# Patient Record
Sex: Female | Born: 1985
Health system: Southern US, Community
[De-identification: ages and names within clinical notes are randomized; demographics above are authoritative.]

## PROBLEM LIST (undated history)

## (undated) DIAGNOSIS — Z789 Other specified health status: Secondary | ICD-10-CM

## (undated) HISTORY — DX: Other specified health status: Z78.9

## (undated) HISTORY — PX: WISDOM TOOTH EXTRACTION: SHX21

---

## 2006-11-14 ENCOUNTER — Emergency Department: Payer: Self-pay | Admitting: Unknown Physician Specialty

## 2006-12-27 ENCOUNTER — Emergency Department: Payer: Self-pay | Admitting: Emergency Medicine

## 2008-03-28 ENCOUNTER — Observation Stay: Payer: Self-pay | Admitting: Obstetrics and Gynecology

## 2008-04-17 ENCOUNTER — Ambulatory Visit: Payer: Self-pay

## 2008-04-21 ENCOUNTER — Inpatient Hospital Stay: Payer: Self-pay

## 2008-07-07 ENCOUNTER — Emergency Department: Payer: Self-pay | Admitting: Emergency Medicine

## 2010-04-25 ENCOUNTER — Emergency Department: Payer: Self-pay | Admitting: Emergency Medicine

## 2013-03-21 ENCOUNTER — Emergency Department: Payer: Self-pay | Admitting: Emergency Medicine

## 2013-03-21 LAB — COMPREHENSIVE METABOLIC PANEL
Albumin: 3.9 g/dL (ref 3.4–5.0)
Alkaline Phosphatase: 89 U/L (ref 50–136)
Anion Gap: 5 — ABNORMAL LOW (ref 7–16)
Calcium, Total: 9.3 mg/dL (ref 8.5–10.1)
Creatinine: 0.86 mg/dL (ref 0.60–1.30)
Glucose: 140 mg/dL — ABNORMAL HIGH (ref 65–99)
Osmolality: 280 (ref 275–301)
Potassium: 3.4 mmol/L — ABNORMAL LOW (ref 3.5–5.1)
SGOT(AST): 28 U/L (ref 15–37)
Sodium: 138 mmol/L (ref 136–145)

## 2013-03-21 LAB — CBC
HGB: 13.6 g/dL (ref 12.0–16.0)
MCHC: 35.5 g/dL (ref 32.0–36.0)
MCV: 86 fL (ref 80–100)
Platelet: 165 10*3/uL (ref 150–440)
RBC: 4.47 10*6/uL (ref 3.80–5.20)

## 2014-03-13 IMAGING — CT CT HEAD WITHOUT CONTRAST
2 series · 16 of 30 positions shown, 20 images · non-contrast
Comparison: none

REASON FOR EXAM: dizziness
COMMENTS:

[Series 2: without · axial · non-contrast · 0.38mm/px · z∈[+441,+561]mm · 13 of 29 slices shown, 17 images]
[im 3/29  brain]
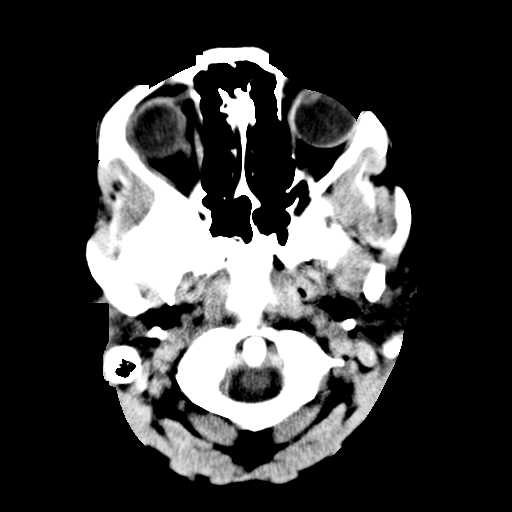
[im 3/29  bone]
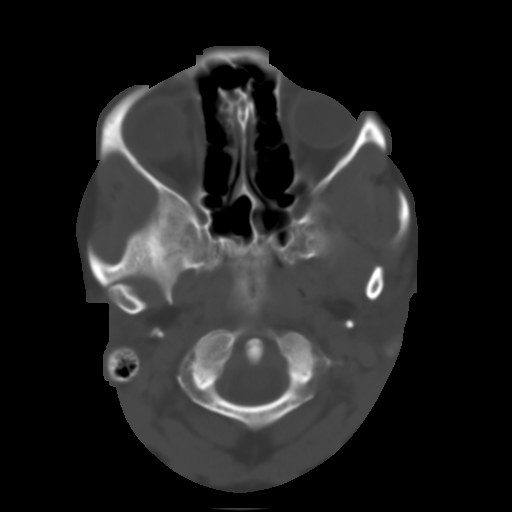
[im 5/29  brain]
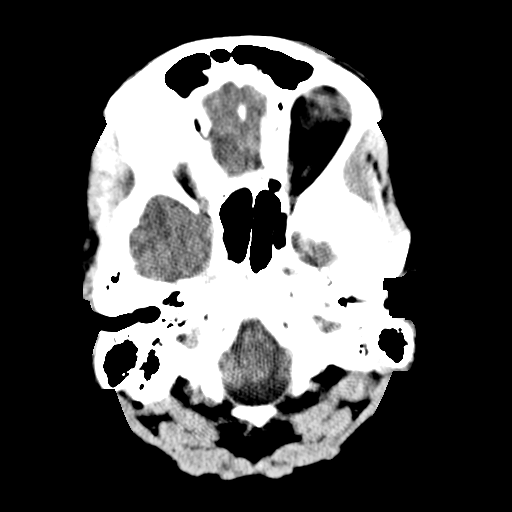
[im 7/29  brain]
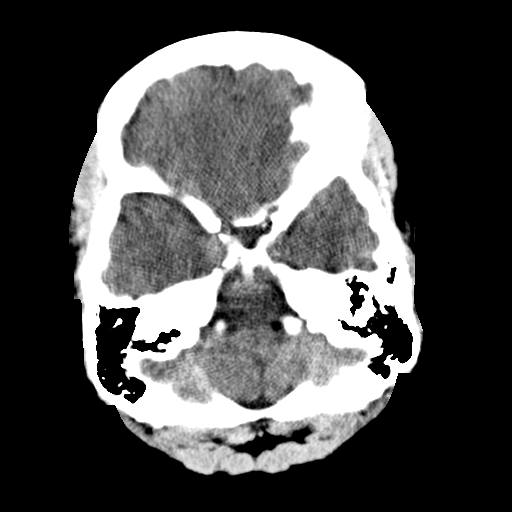
[im 9/29  brain]
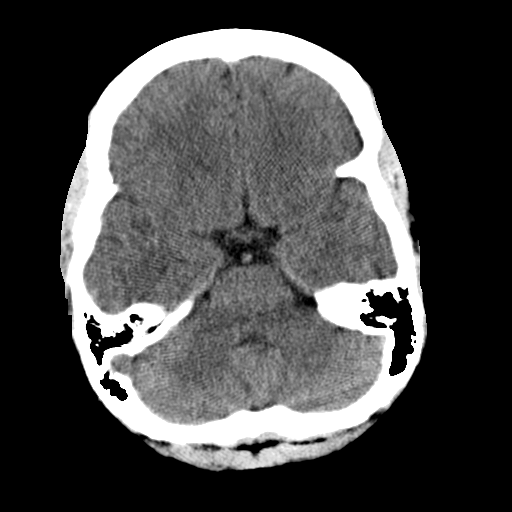
[im 11/29  brain]
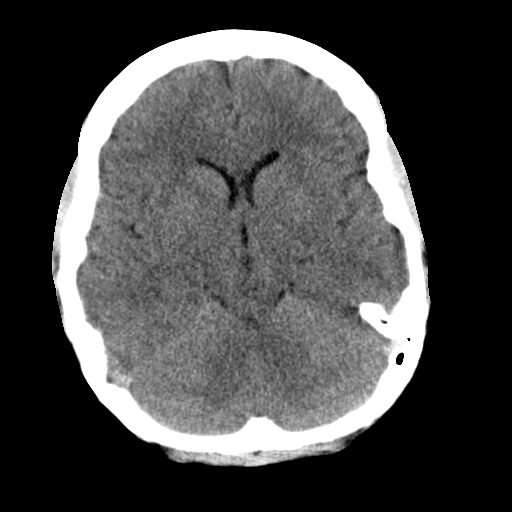
[im 11/29  bone]
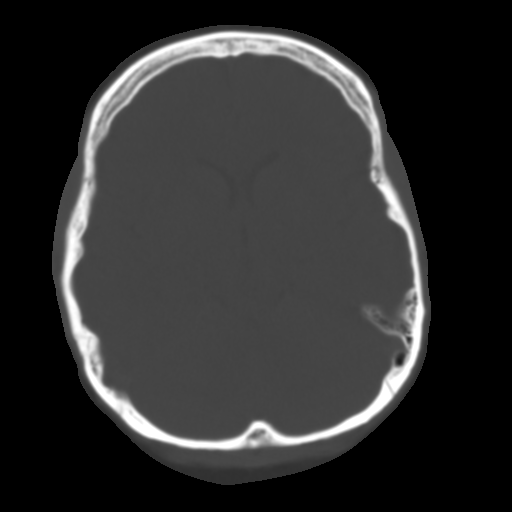
[im 13/29  brain]
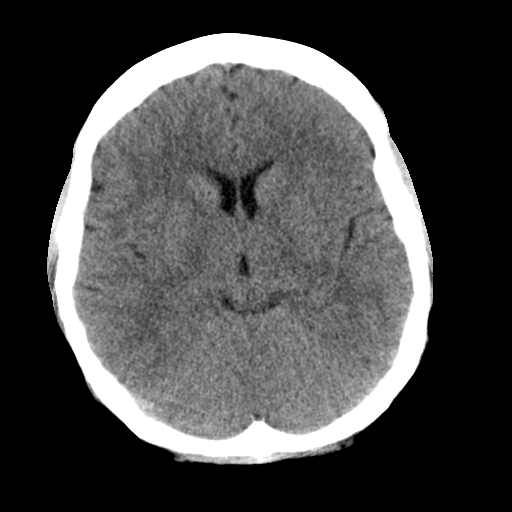
[im 15/29  brain]
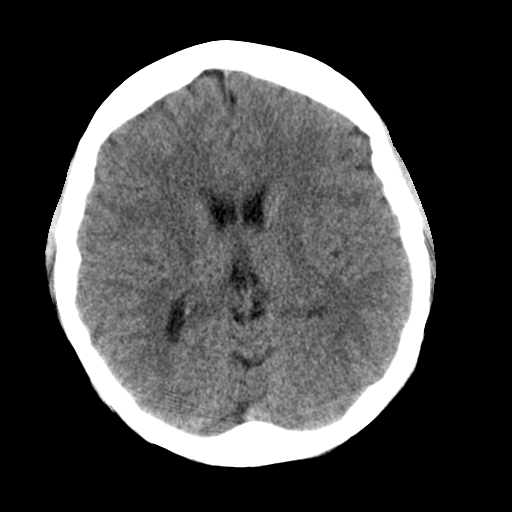
[im 17/29  brain]
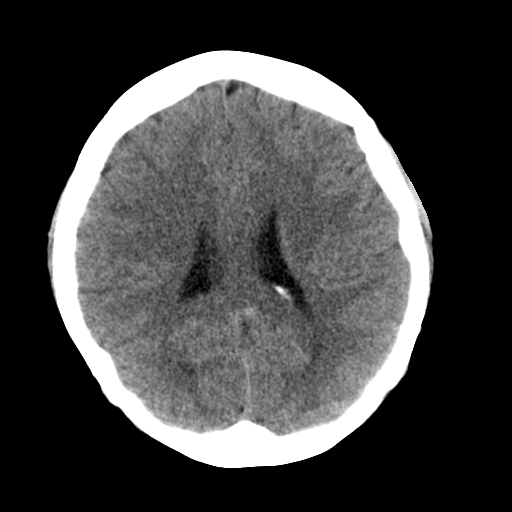
[im 19/29  brain]
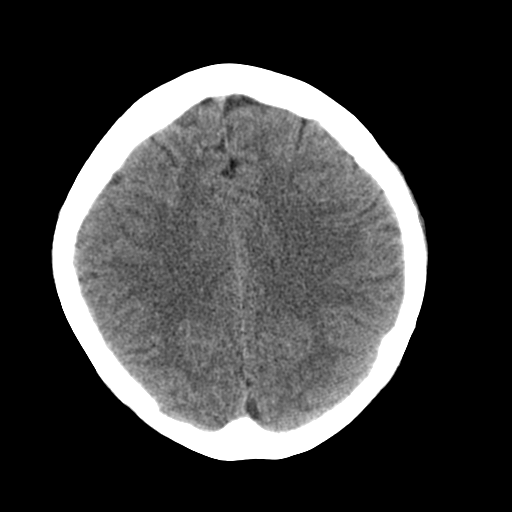
[im 19/29  bone]
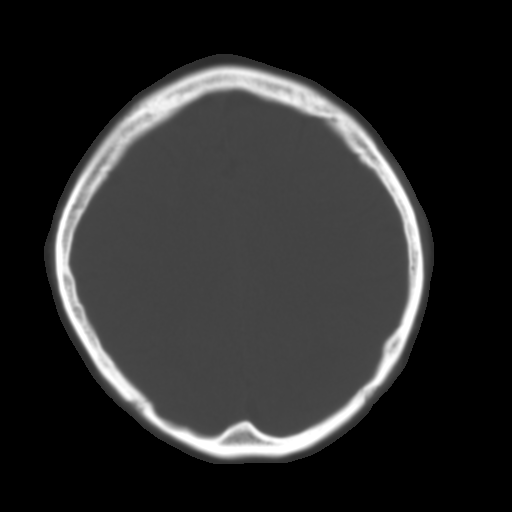
[im 21/29  brain]
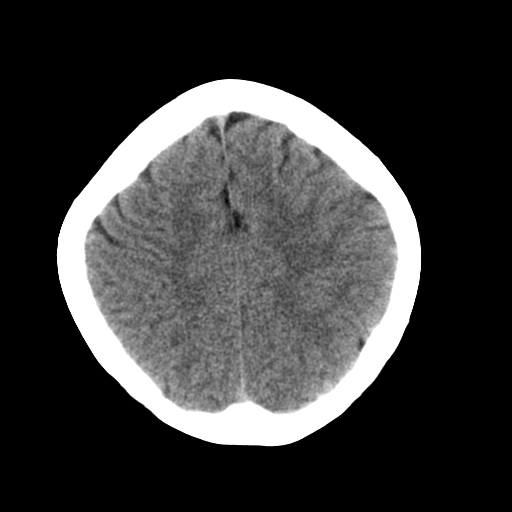
[im 23/29  brain]
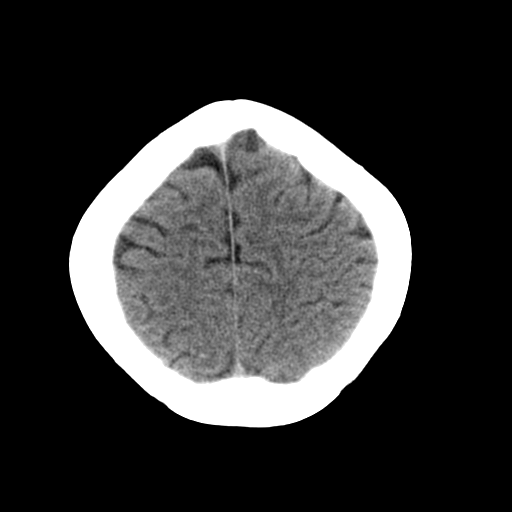
[im 25/29  brain]
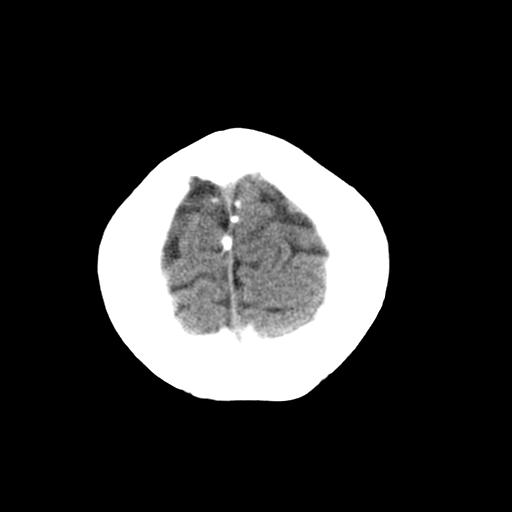
[im 27/29  brain]
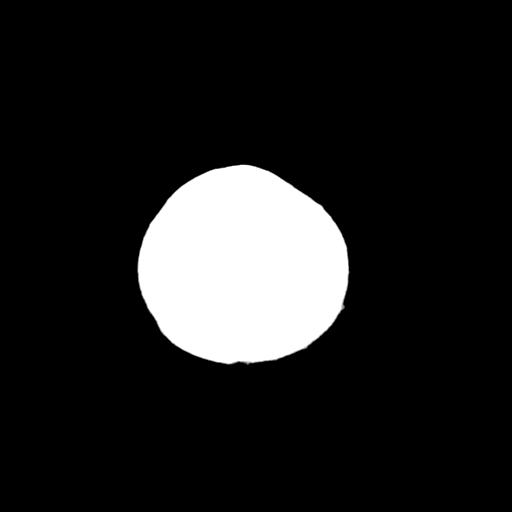
[im 27/29  bone]
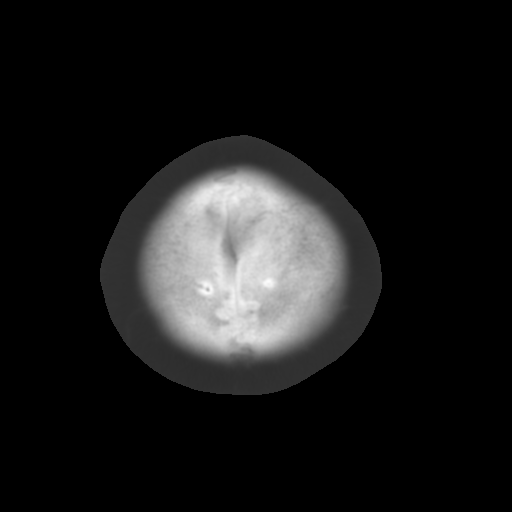

[Series 3: bone · axial · 0.38mm/px · z∈[+441,+481]mm · 3 of 29 slices shown]
[im 3/29  bone]
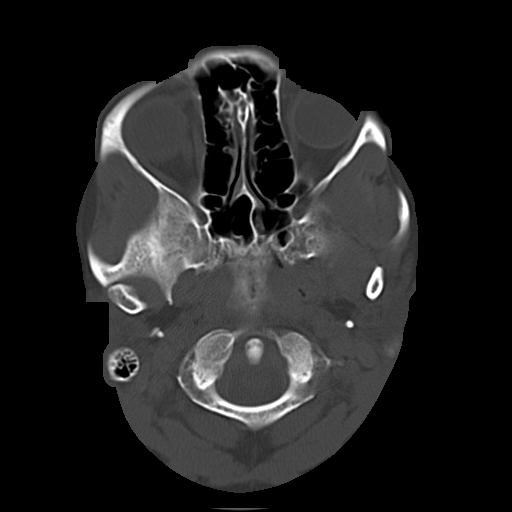
[im 7/29  bone]
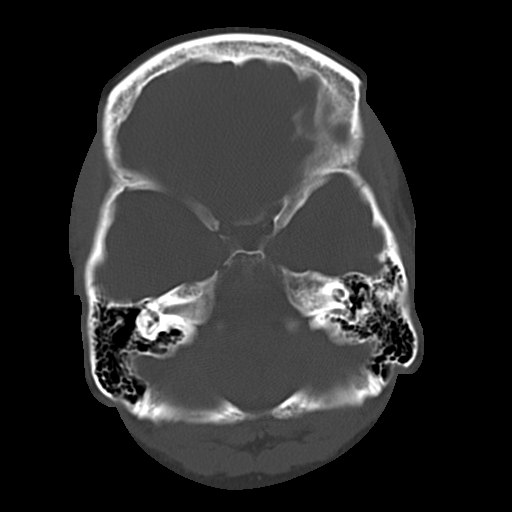
[im 11/29  bone]
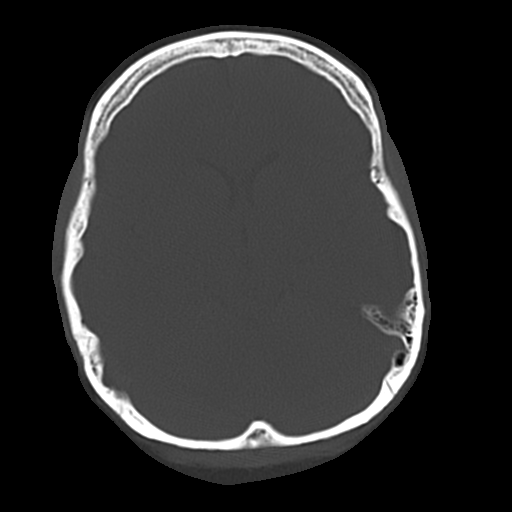

[16 of 30 positions shown; findings below may reference images not displayed]

PROCEDURE:     CT  - CT HEAD WITHOUT CONTRAST  - March 22, 2013  [DATE]

RESULT:     Axial noncontrast CT scanning was performed through the brain
with reconstructions at 5 mm intervals and slice thicknesses.

The ventricles are normal in size and position. There is no intracranial
hemorrhage nor intracranial mass effect. There is no evidence of an evolving
ischemic infarction. The cerebellum and brainstem are normal in density. At
bone window settings there is no evidence of an acute skull fracture.
IMPRESSION: Normal noncontrast CT scan of the brain.

A preliminary report was sent to the [HOSPITAL] the conclusion
of the study.

[REDACTED]

## 2016-02-08 ENCOUNTER — Encounter: Payer: Self-pay | Admitting: Emergency Medicine

## 2016-02-08 ENCOUNTER — Emergency Department
Admission: EM | Admit: 2016-02-08 | Discharge: 2016-02-08 | Disposition: A | Discharge status: Home / Self Care | Payer: BLUE CROSS/BLUE SHIELD | Attending: Emergency Medicine | Admitting: Emergency Medicine

## 2016-02-08 DIAGNOSIS — Y9389 Activity, other specified: Secondary | ICD-10-CM | POA: Insufficient documentation

## 2016-02-08 DIAGNOSIS — L539 Erythematous condition, unspecified: Secondary | ICD-10-CM | POA: Diagnosis present

## 2016-02-08 DIAGNOSIS — Y929 Unspecified place or not applicable: Secondary | ICD-10-CM | POA: Diagnosis not present

## 2016-02-08 DIAGNOSIS — Y999 Unspecified external cause status: Secondary | ICD-10-CM | POA: Insufficient documentation

## 2016-02-08 DIAGNOSIS — T63441A Toxic effect of venom of bees, accidental (unintentional), initial encounter: Secondary | ICD-10-CM

## 2016-02-08 MED ORDER — HYDROXYZINE PAMOATE 25 MG PO CAPS: 25.0000 mg | ORAL_CAPSULE | Freq: Three times a day (TID) | ORAL | Status: DC | PRN

## 2016-02-08 MED ORDER — HYDROXYZINE HCL 25 MG PO TABS
25.0000 mg | ORAL_TABLET | Freq: Once | ORAL | Status: AC
  Administered 2016-02-08: 25 mg via ORAL
  Filled 2016-02-08: qty 1

## 2016-02-08 MED ORDER — CEPHALEXIN 500 MG PO CAPS: 500.0000 mg | ORAL_CAPSULE | Freq: Four times a day (QID) | ORAL | Status: DC

## 2016-02-08 MED ORDER — PREDNISONE 10 MG PO TABS: 50.0000 mg | ORAL_TABLET | Freq: Every day | ORAL | Status: DC

## 2016-02-08 MED ORDER — PREDNISOLONE SODIUM PHOSPHATE 15 MG/5ML PO SOLN
ORAL | Status: AC
  Filled 2016-02-08: qty 5

## 2016-02-08 NOTE — ED Provider Notes (Signed)
[facility] Emergency Department Provider Note  ____________________________________________  Time seen: Approximately 9:52 PM  I have reviewed the triage vital signs and the nursing notes.   HISTORY  Chief Complaint Leg Swelling and Insect Bite    HPI Angel Johnson is a 30 y.o. female presents for evaluation of swelling and itching and redness to the back of her right hand right ankle and foot. Multiple yellowjacket stings. Patient feels like the redness is actually getting worse and has noticed 2 small blisters to the right ankle. Patient states onset was yesterday while mowing the grass and has progressively worsened.   History reviewed. No pertinent past medical history.  There are no active problems to display for this patient.   History reviewed. No pertinent past surgical history.  Current Outpatient Rx  Name  Route  Sig  Dispense  Refill  . cephALEXin (KEFLEX) 500 MG capsule   Oral   Take 1 capsule (500 mg total) by mouth 4 (four) times daily.   40 capsule   0   . hydrOXYzine (VISTARIL) 25 MG capsule   Oral   Take 1 capsule (25 mg total) by mouth 3 (three) times daily as needed.   30 capsule   0   . predniSONE (DELTASONE) 10 MG tablet   Oral   Take 5 tablets (50 mg total) by mouth daily with breakfast.   25 tablet   0     Allergies Review of patient's allergies indicates no known allergies.  No family history on file.  Social History Social History  Substance Use Topics  . Smoking status: Never Smoker   . Smokeless tobacco: Never Used  . Alcohol Use: No    Review of Systems Constitutional: No fever/chills ENT: No sore throat. Cardiovascular: Denies chest pain. Respiratory: Denies shortness of breath. Gastrointestinal: No abdominal pain.  No nausea, no vomiting.  No diarrhea.  No constipation. Genitourinary: Negative for dysuria. Musculoskeletal: Negative for back pain. Skin: Positive for rash swelling and redness  to the ankle and hand. Neurological: Negative for headaches, focal weakness or numbness.  10-point ROS otherwise negative.  ____________________________________________   PHYSICAL EXAM:  VITAL SIGNS: ED Triage Vitals  Enc Vitals Group     BP 02/08/16 2126 134/73 mmHg     Pulse Rate 02/08/16 2126 98     Resp 02/08/16 2126 20     Temp 02/08/16 2126 99.9 F (37.7 C)     Temp Source 02/08/16 2126 Oral     SpO2 02/08/16 2126 100 %     Weight 02/08/16 2126 162 lb (73.483 kg)     Height 02/08/16 2126  (1.676 m)     Head Cir --      Peak Flow --      Pain Score 02/08/16 2131 7     Pain Loc --      Pain Edu? --      Excl. in GC? --     Constitutional: Alert and oriented. Well appearing and in no acute distress. Neck: No stridor.   Cardiovascular: Normal rate, regular rhythm. Grossly normal heart sounds.  Good peripheral circulation. Respiratory: Normal respiratory effort.  No retractions. Lungs CTAB. Musculoskeletal: Positive for lower extremity tenderness swelling Neurologic:  Normal speech and language. No gross focal neurologic deficits are appreciated. No gait instability. Skin:  Skin is warm, dry and intact. Positive erythema, positive edema, to the right foot and right hand and ankle. Positive vesicular lesions 2 about 0.5 cm in size to  the posterior aspect of the right ankle. Psychiatric: Mood and affect are normal. Speech and behavior are normal.  ____________________________________________   LABS (all labs ordered are listed, but only abnormal results are displayed)  Labs Reviewed - No data to display ____________________________________________  EKG   ____________________________________________  RADIOLOGY   ____________________________________________   PROCEDURES  Procedure(s) performed: None  Critical Care performed: No  ____________________________________________   INITIAL IMPRESSION / ASSESSMENT AND PLAN / ED COURSE  Pertinent labs &  imaging results that were available during my care of the patient were reviewed by me and considered in my medical decision making (see chart for details).  Bee stings with cellulitis. Our skin for Keflex, Atarax, prednisone. Patient to follow up PCP or return here with any worsening symptomology. ____________________________________________   FINAL CLINICAL IMPRESSION(S) / ED DIAGNOSES  Final diagnoses:  Bee sting reaction, accidental or unintentional, initial encounter     This chart was dictated using voice recognition software/Dragon. Despite best efforts to proofread, errors can occur which can change the meaning. Any change was purely unintentional.   Evangeline Dakinharles M Mardell Suttles, PA-C 02/08/16 2224  Jeanmarie PlantJames A McShane, MD 02/08/16 [phone]

## 2016-02-08 NOTE — Discharge Instructions (Signed)
Bee, Wasp, or [employer], wasps, and hornets are part of a family of insects that can sting people. These stings can cause pain and inflammation, but they are usually not serious. However, some people may have an allergic reaction to a sting. This can cause the symptoms to be more severe.  SYMPTOMS  Common symptoms of this condition include:   A red lump in the skin that sometimes has a tiny hole in the center. In some cases, a stinger may be in the center of the wound.  Pain and itching at the sting site.  Redness and swelling around the sting site. If you have an allergic reaction (localized allergic reaction), the swelling and redness may spread out from the sting site. In some cases, this reaction can continue to develop over the next 12-36 hours. In rare cases, a person may have a severe allergic reaction (anaphylactic reaction) to a sting. Symptoms of an anaphylactic reaction may include:   Wheezing or difficulty breathing.  Raised, itchy, red patches on the skin.  Nausea or vomiting.  Abdominal cramping.  Diarrhea.  Chest pain.  Fainting.  Redness of the face (flushing). DIAGNOSIS  This condition is usually diagnosed based on symptoms, medical history, and a physical exam. TREATMENT  Most stings can be treated with:   Icing to reduce swelling.  Medicines (antihistamines) to treat itching or an allergic reaction.  Medicines to help reduce pain. These may be medicines that you take by mouth, or medicated creams or lotions that you apply to your skin. If you were stung by a bee, the stinger and a small sac of poison may be in the wound. This may be removed by brushing across it with a flat card, such as a credit card. Another method is to pinch the area and pull it out. These methods can help reduce the severity of the body's reaction to the sting.  HOME CARE INSTRUCTIONS   Wash the sting site daily with soap and water as told by your health care provider.  Apply  or take over-the-counter and prescription medicines only as told by your health care provider.  If directed, apply ice to the sting area.  Put ice in a plastic bag.  Place a towel between your skin and the bag.  Leave the ice on for 20 minutes, 2-3 times per day.  Do not scratch the sting area.  To lessen pain, try using a paste that is made of water and baking soda. Rub the paste on the sting area and leave it on for 5 minutes.  If you had a severe allergic reaction to a sting, you may need:  To wear a medical bracelet or necklace that lists the allergy.  To learn when and how to use an anaphylaxis kit or epinephrine injection. Your family members may also need to learn this.  To carry an anaphylaxis kit with you at all times. SEEK MEDICAL CARE IF:   Your symptoms do not get better in 2-3 days.  You have redness, swelling, or pain that spreads beyond the area of the sting.  You have a fever. SEEK IMMEDIATE MEDICAL CARE IF:  You have symptoms of a severe allergic reaction. These include:   Wheezing or difficulty breathing.  Chest pain.  Light-headedness or fainting.  Itchy, raised, red patches on the skin.  Nausea or vomiting.  Abdominal cramping.  Diarrhea.   This information is not intended to replace advice given to you by your health care provider.  Make sure you discuss any questions you have with your health care provider. °  °Document Released: 07/13/2005 Document Revised: 04/03/2015 Document Reviewed: 11/28/2014 °Elsevier Interactive Patient Education ©2016 [employer] ° °

## 2016-02-08 NOTE — ED Notes (Signed)
Pt states was mowing grass yesterday am when she was stung by bees. Pt with swelling from sting noted to right ankle, right hand. Redness noted to right lateral ankle. Cms intact.

## 2016-02-08 NOTE — ED Notes (Signed)
See triage note. Pt c/o swelling, itching, and redness to back of R hand and R ankle and foot from multiple yellow jacket stings. Pt states she feels like redness has not improved. Two small blisters noted to R ankle. 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 Has benadryl at home but has not taken it.

## 2017-02-26 ENCOUNTER — Ambulatory Visit: Payer: Self-pay | Admitting: Certified Nurse Midwife

## 2017-02-26 NOTE — Progress Notes (Deleted)
Gynecology Annual Exam  PCP: No primary care provider on file.  Chief Complaint: No chief complaint on file.   History of Present Illness: Angel Johnson is a 31 y.o. No obstetric history on file. presents for annual exam. The patient {Blank single:19197::"has no complaints today.","complains of ***"}  Her menses are regular, they occur every month, and they last *** days. Her flow is {Blank single:19197::"light","heavy","moderate"}. She {Blank single:19197::"does","does not"} have intermenstrual bleeding. Her last menstrual period was ***. She denies dysmenorrhea. Last pap smear: ***, results were ***   The patient {Blank single:19197::"has never been","is not currently","is not","is"}  sexually active. She currently uses *** for contraception. She {Blank single:19197::"has","does not have"} dyspareunia.  {Blank single:19197::"Since her last visit, she has ***","Since her last visit, she has had no significant changes in her health."}  Her past medical history is remarkable for ***  The patient {Blank single:19197::"does not know how to","does not","does"} perform self breast exams. Her last mammogram was ***, results were ***.   {Blank single:19197::"There is a family history of breast cancer in her ***","There is no family history of breast cancer."} Genetic testing {Blank single:19197::"has","has not"} been done.   {Blank single:19197::"There is a family history of ovarian cancer in her ***","There is no family history of ovarian cancer."} Genetic testing {Blank single:19197::"has","has not"} been done.  The patient {Blank single:19197::"reports smoking. She smokes *** packs per day.","denies smoking."}  She {Blank single:19197::"denies drinking.","reports drinking alcohol. She reports have *** drinks per week."}   She {Blank single:19197::"reports illegal drug use. She uses ***","denies illegal drug use."}  The patient {Blank single:19197::"does not exercise","reports  exercising occasionally","reports exercising regularly"}.  The patient {Blank single:19197::"reports","denies"} current symptoms of depression.    Review of Systems: Review of Systems  Constitutional: Negative for chills, fever and weight loss.  HENT: Negative for congestion, sinus pain and sore throat.   Eyes: Negative for blurred vision and pain.  Respiratory: Negative for hemoptysis, shortness of breath and wheezing.   Cardiovascular: Negative for chest pain, palpitations and leg swelling.  Gastrointestinal: Negative for abdominal pain, blood in stool, diarrhea, heartburn, nausea and vomiting.  Genitourinary: Negative for dysuria, frequency, hematuria and urgency.  Musculoskeletal: Negative for back pain, joint pain and myalgias.  Skin: Negative for itching and rash.  Neurological: Negative for dizziness, tingling and headaches.  Endo/Heme/Allergies: Negative for environmental allergies and polydipsia. Does not bruise/bleed easily.       Negative for hirsutism   Psychiatric/Behavioral: Negative for depression. The patient is not nervous/anxious and does not have insomnia.     Past Medical History:  No past medical history on file.  Past Surgical History:  No past surgical history on file.  Family History:  No family history on file.  Social History:  Social History   Social History  . Marital status: Single    Spouse name: N/A  . Number of children: N/A  . Years of education: N/A   Occupational History  . Not on file.   Social History Main Topics  . Smoking status: Never Smoker  . Smokeless tobacco: Never Used  . Alcohol use No  . Drug use: No  . Sexual activity: Not on file   Other Topics Concern  . Not on file   Social History Narrative  . No narrative on file    Allergies:  No Known Allergies  Medications: Prior to Admission medications   Medication Sig Start Date End Date Taking? Authorizing Provider  cephALEXin (KEFLEX) 500 MG capsule Take 1  capsule (  500 mg total) by mouth 4 (four) times daily. 02/08/16   Beers, Charmayne Sheerharles M, PA-C  hydrOXYzine (VISTARIL) 25 MG capsule Take 1 capsule (25 mg total) by mouth 3 (three) times daily as needed. 02/08/16   Beers, Charmayne Sheerharles M, PA-C  predniSONE (DELTASONE) 10 MG tablet Take 5 tablets (50 mg total) by mouth daily with breakfast. 02/08/16   Evangeline DakinBeers, Charles M, PA-C    Physical Exam Vitals: There were no vitals taken for this visit.  General: NAD HEENT: normocephalic, anicteric Neck: no thyroid enlargement, no palpable nodules, no cervical lymphadenopathy  Pulmonary: No increased work of breathing, CTAB Cardiovascular: RRR, {Blank single:19197::"with murmur","without murmur"}  Breast: Breast symmetrical, no tenderness, no palpable nodules or masses, no skin or nipple retraction present, no nipple discharge.  No axillary, infraclavicular or supraclavicular lymphadenopathy. Abdomen: Soft, non-tender, non-distended.  Umbilicus without lesions.  No hepatomegaly or masses palpable. No evidence of hernia. Genitourinary:  External: Normal external female genitalia.  Normal urethral meatus, normal  Bartholin's and Skene's glands.    Vagina: Normal vaginal mucosa, no evidence of prolapse.    Cervix: Grossly normal in appearance, no bleeding, non-tender  Uterus: Anteverted, normal size, shape, and consistency, mobile, and non-tender  Adnexa: No adnexal masses, non-tender  Rectal: deferred  Lymphatic: no evidence of inguinal lymphadenopathy Extremities: no edema, erythema, or tenderness Neurologic: Grossly intact Psychiatric: mood appropriate, affect full     Assessment: 31 y.o. No obstetric history on file. No problem-specific Assessment & Plan notes found for this encounter.   Plan:  ***  1) Breast cancer screening - recommend monthly self breast exam. {Blank single:19197::" ","Mammogram is up to date.","Mammogram was ordered today."}  2) STI screening was offered and {Blank  single:19197::"accepted","declined"}.  3) Cervical cancer screening - {Blank single:19197::"Pap smear due in *** years","Pap not indicated","Pap was done"}. ASCCP guidelines and rational discussed.  Patient opts for {Blank single:19197::"every 5 years","every 3 years","yearly"} screening interval  4) Contraception - Education given regarding options for contraception  5) Routine healthcare maintenance including cholesterol and diabetes screening {Blank single:19197::"declined","managed by PCP","ordered today"}

## 2018-06-22 ENCOUNTER — Ambulatory Visit (INDEPENDENT_AMBULATORY_CARE_PROVIDER_SITE_OTHER): Payer: BLUE CROSS/BLUE SHIELD | Admitting: Obstetrics and Gynecology

## 2018-06-22 ENCOUNTER — Encounter: Payer: Self-pay | Admitting: Obstetrics and Gynecology

## 2018-06-22 VITALS — BP 110/84 | HR 94 | Ht 65.0 in | Wt 172.0 lb

## 2018-06-22 DIAGNOSIS — Z30014 Encounter for initial prescription of intrauterine contraceptive device: Secondary | ICD-10-CM | POA: Diagnosis not present

## 2018-06-22 NOTE — Patient Instructions (Signed)
I value your feedback and entrusting us with your care. If you get a  patient survey, I would appreciate you taking the time to let us know about your experience today. Thank you! 

## 2018-06-22 NOTE — Progress Notes (Signed)
Patient, No Pcp Per   Chief Complaint  Patient presents with  . Contraception    BC consultation, thinking of Mirena    HPI:      Angel Johnson is a 32 y.o. G1P1001 who LMP was No LMP recorded. Patient has had an injection., [facility]. Pt currently on depo and has been for about 10 yrs. No bleeding/spotting. Next depo due ~08/15/2018. Wants to change to IUD. Never had one before. No hx of HTN, DVTs, migraines. She is sex active, no pain. Current on pap ~10/18.   No PMH  History reviewed. No pertinent past medical history.  History reviewed. No pertinent surgical history.  Family History  Problem Relation Age of Onset  . Breast cancer Paternal Grandmother   . Hypertension Paternal Grandmother     Social History   Socioeconomic History  . Marital status: Single    Spouse name: Not on file  . Number of children: Not on file  . Years of education: Not on file  . Highest education level: Not on file  Occupational History  . Not on file  Social Needs  . Financial resource strain: Not on file  . Food insecurity:    Worry: Not on file    Inability: Not on file  . Transportation needs:    Medical: Not on file    Non-medical: Not on file  Tobacco Use  . Smoking status: Never Smoker  . Smokeless tobacco: Never Used  Substance and Sexual Activity  . Alcohol use: No  . Drug use: No  . Sexual activity: Yes    Birth control/protection: Injection  Lifestyle  . Physical activity:    Days per week: Not on file    Minutes per session: Not on file  . Stress: Not on file  Relationships  . Social connections:    Talks on phone: Not on file    Gets together: Not on file    Attends religious service: Not on file    Active member of club or organization: Not on file    Attends meetings of clubs or organizations: Not on file    Relationship status: Not on file  . Intimate partner violence:    Fear of current or ex partner: Not on file   Emotionally abused: Not on file    Physically abused: Not on file    Forced sexual activity: Not on file  Other Topics Concern  . Not on file  Social History Narrative  . Not on file    Outpatient Medications Prior to Visit  Medication Sig Dispense Refill  . DENTA 5000 PLUS 1.1 % CREA dental cream   10  . medroxyPROGESTERone (DEPO-PROVERA) 150 MG/ML injection   2  . cephALEXin (KEFLEX) 500 MG capsule Take 1 capsule (500 mg total) by mouth 4 (four) times daily. 40 capsule 0  . hydrOXYzine (VISTARIL) 25 MG capsule Take 1 capsule (25 mg total) by mouth 3 (three) times daily as needed. 30 capsule 0  . predniSONE (DELTASONE) 10 MG tablet Take 5 tablets (50 mg total) by mouth daily with breakfast. 25 tablet 0   No facility-administered medications prior to visit.       ROS:  Review of Systems  Constitutional: Negative for fatigue, fever and unexpected weight change.  Respiratory: Negative for cough, shortness of breath and wheezing.   Cardiovascular: Negative for chest pain, palpitations and leg swelling.  Gastrointestinal: Negative for blood in stool, constipation, diarrhea, nausea  and vomiting.  Endocrine: Negative for cold intolerance, heat intolerance and polyuria.  Genitourinary: Negative for dyspareunia, dysuria, flank pain, frequency, genital sores, hematuria, menstrual problem, pelvic pain, urgency, vaginal bleeding, vaginal discharge and vaginal pain.  Musculoskeletal: Negative for back pain, joint swelling and myalgias.  Skin: Negative for rash.  Neurological: Negative for dizziness, syncope, light-headedness, numbness and headaches.  Hematological: Negative for adenopathy.  Psychiatric/Behavioral: Negative for agitation, confusion, sleep disturbance and suicidal ideas. The patient is not nervous/anxious.    BREAST: No symptoms   OBJECTIVE:   Vitals:  BP 110/84   Pulse 94   Ht 5\' 5"  (1.651 m)   Wt 172 lb (78 kg)   BMI 28.62 kg/m   Physical Exam  Constitutional:  She is oriented to person, place, and time. She appears well-developed.  Neck: Normal range of motion.  Pulmonary/Chest: Effort normal.  Musculoskeletal: Normal range of motion.  Neurological: She is alert and oriented to person, place, and time. No cranial nerve deficit.  Psychiatric: She has a normal mood and affect. Her behavior is normal. Judgment and thought content normal.  Vitals reviewed.   Assessment/Plan: Encounter for initial prescription of intrauterine contraceptive device (IUD) - IUD pros/cons/risks/benefits discussed. Pt interested, handout giv. Will call to sched when depo closer to expiration. Needs Rx cytotec/NSAIDs 1 hr before appt.    Return if symptoms worsen or fail to improve.  Marisol Giambra B. Rosalynd Mcwright, PA-C 06/22/2018 2:32 PM

## 2018-07-07 ENCOUNTER — Telehealth: Payer: Self-pay | Admitting: Obstetrics and Gynecology

## 2018-07-07 NOTE — Telephone Encounter (Signed)
Patient is schedule 07/28/17 at 9:30 for mirena insertion with [facility]

## 2018-07-08 NOTE — Telephone Encounter (Signed)
Noted. Will order to arrive by apt date/time. 

## 2018-07-26 ENCOUNTER — Other Ambulatory Visit: Payer: Self-pay | Admitting: Obstetrics and Gynecology

## 2018-07-26 ENCOUNTER — Telehealth: Payer: Self-pay

## 2018-07-26 DIAGNOSIS — Z30430 Encounter for insertion of intrauterine contraceptive device: Secondary | ICD-10-CM

## 2018-07-26 MED ORDER — MISOPROSTOL 200 MCG PO TABS: 200.0000 ug | ORAL_TABLET | Freq: Once | ORAL | 0 refills | Status: DC

## 2018-07-26 MED ORDER — IBUPROFEN 800 MG PO TABS: 800.0000 mg | ORAL_TABLET | Freq: Three times a day (TID) | ORAL | 0 refills | Status: DC | PRN

## 2018-07-26 NOTE — Telephone Encounter (Signed)
[facility]'s note says cytotec.

## 2018-07-26 NOTE — Telephone Encounter (Signed)
Prescription sent for ibuprofen and cytotec , please let patient know

## 2018-07-26 NOTE — Telephone Encounter (Signed)
Pt aware.

## 2018-07-26 NOTE — Telephone Encounter (Signed)
What does she need to take?  A medication to soften the cervix or a medication for anxiety?

## 2018-07-26 NOTE — Telephone Encounter (Signed)
Pt has appt 07/28/18 for IUD insertion and is supposed to have a rx called in for her to take one hour before procedure.  Is calling to f/u on it.  [phone].

## 2018-07-26 NOTE — Progress Notes (Unsigned)
cyo

## 2018-07-28 ENCOUNTER — Encounter: Payer: Self-pay | Admitting: Obstetrics and Gynecology

## 2018-07-28 ENCOUNTER — Other Ambulatory Visit (HOSPITAL_COMMUNITY)
Admission: RE | Admit: 2018-07-28 | Discharge: 2018-07-28 | Disposition: A | Discharge status: Home / Self Care | Payer: Managed Care, Other (non HMO) | Source: Ambulatory Visit | Attending: Obstetrics and Gynecology | Admitting: Obstetrics and Gynecology

## 2018-07-28 ENCOUNTER — Ambulatory Visit (INDEPENDENT_AMBULATORY_CARE_PROVIDER_SITE_OTHER): Payer: Managed Care, Other (non HMO) | Admitting: Obstetrics and Gynecology

## 2018-07-28 VITALS — BP 130/80 | HR 115 | Ht 66.0 in | Wt 172.0 lb

## 2018-07-28 DIAGNOSIS — Z113 Encounter for screening for infections with a predominantly sexual mode of transmission: Secondary | ICD-10-CM

## 2018-07-28 DIAGNOSIS — Z30430 Encounter for insertion of intrauterine contraceptive device: Secondary | ICD-10-CM

## 2018-07-28 NOTE — Patient Instructions (Signed)
I value your feedback and entrusting us with your care. If you get a [facility] patient survey, I would appreciate you taking the time to let us know about your experience today. Thank you!  [facility] [phone]  Instructions after IUD insertion  Most women experience no significant problems after insertion of an IUD, however minor cramping and spotting for a few days is common. Cramps may be treated with ibuprofen 800mg every 8 hours or Tylenol 650 mg every 4 hours. Contact [facility] immediately if you experience any of the following symptoms during the next week: temperature >99.6 degrees, worsening pelvic pain, abdominal pain, fainting, unusually heavy vaginal bleeding, foul vaginal discharge, or if you think you have expelled the IUD.  Nothing inserted in the vagina for 48 hours. You will be scheduled for a follow up visit in approximately four weeks.  You should check monthly to be sure you can feel the IUD strings in the upper vagina. If you are having a monthly period, try to check after each period. If you cannot feel the IUD strings,  contact [facility] immediately so we can do an exam to determine if the IUD has been expelled.   Please use backup protection until we can confirm the IUD is in place.  Call [facility] if you are exposed to or diagnosed with a sexually transmitted infection, as we will need to discuss whether it is safe for you to continue using an IUD.   

## 2018-07-28 NOTE — Progress Notes (Signed)
   Chief Complaint  Patient presents with  . Contraception    Mirena insertion     IUD PROCEDURE NOTE:  Angel Johnson is a 33 y.o. G1P1001 here for Mirena  IUD insertion for [employer]. Currently on depo, due 08/15/18. Current on pap with PCP.   BP 130/80   Pulse (!) 115   Ht 5\' 6"  (1.676 m)   Wt 172 lb (78 kg)   BMI 27.76 kg/m   IUD Insertion Procedure Note Patient identified, informed consent performed, consent signed.   Discussed risks of irregular bleeding, cramping, infection, malpositioning or misplacement of the IUD outside the uterus which may require further procedure such as laparoscopy, risk of failure <1%. Time out was performed.    Speculum placed in the vagina.  Cervix visualized.  Cleaned with Betadine x 2.  Grasped anteriorly with a single tooth tenaculum.  Uterus sounded to 8.0 cm.   IUD placed per manufacturer's recommendations.  Strings trimmed to 3 cm. Tenaculum was removed, good hemostasis noted.  Patient tolerated procedure well.   ASSESSMENT:  Encounter for insertion of intrauterine contraceptive device (IUD) - Mirena placed.  Screening for STD (sexually transmitted disease) - Plan: Cervicovaginal ancillary only   Plan:  Patient was given post-procedure instructions.  She was advised to have backup contraception for one week.   Call if you are having increasing pain, cramps or bleeding or if you have a fever greater than 100.4 degrees F., shaking chills, nausea or vomiting. Patient was also asked to check IUD strings periodically and follow up in 4 weeks for IUD check.  Return in about 4 weeks (around 08/25/2018) for IUD f/u.  Errin Whitelaw B. Hareem Surowiec, PA-C 07/28/2018 9:43 AM

## 2018-07-29 LAB — CERVICOVAGINAL ANCILLARY ONLY
CHLAMYDIA, DNA PROBE: NEGATIVE
NEISSERIA GONORRHEA: NEGATIVE

## 2018-08-25 ENCOUNTER — Ambulatory Visit (INDEPENDENT_AMBULATORY_CARE_PROVIDER_SITE_OTHER): Payer: Managed Care, Other (non HMO) | Admitting: Obstetrics and Gynecology

## 2018-08-25 ENCOUNTER — Encounter: Payer: Self-pay | Admitting: Obstetrics and Gynecology

## 2018-08-25 VITALS — BP 114/80 | HR 83 | Ht 66.0 in | Wt 176.0 lb

## 2018-08-25 DIAGNOSIS — Z30431 Encounter for routine checking of intrauterine contraceptive device: Secondary | ICD-10-CM

## 2018-08-25 DIAGNOSIS — T8332XA Displacement of intrauterine contraceptive device, initial encounter: Secondary | ICD-10-CM

## 2018-08-25 NOTE — Progress Notes (Signed)
   Chief Complaint  Patient presents with  . IUD Check     History of Present Illness:  Maye Hyppolite is a 33 y.o. that had a Mirena IUD placed approximately 1 month ago. Since that time, she denies dyspareunia, pelvic pain, non-menstrual bleeding, vaginal d/c, heavy bleeding. She stopped depo 07/2018.  Review of Systems  Constitutional: Negative for fever.  Gastrointestinal: Negative for blood in stool, constipation, diarrhea, nausea and vomiting.  Genitourinary: Negative for dyspareunia, dysuria, flank pain, frequency, hematuria, urgency, vaginal bleeding, vaginal discharge and vaginal pain.  Musculoskeletal: Negative for back pain.  Skin: Negative for rash.    Physical Exam:  BP 114/80   Pulse 83   Ht 5\' 6"  (1.676 m)   Wt 176 lb (79.8 kg)   BMI 28.41 kg/m  Body mass index is 28.41 kg/m.  Pelvic exam:  Two IUD strings absent in cervical os. EGBUS, vaginal vault and cervix: within normal limits except small inclusion cyst LT labia minora near introitus.   Assessment:   Encounter for routine checking of intrauterine contraceptive device (IUD) - Plan: US PELVIS TRANSVANGINAL NON-OB (TV ONLY)  Intrauterine contraceptive device threads lost, initial encounter - Check placement with u/s. Will call with results.  - Plan: US PELVIS TRANSVANGINAL NON-OB (TV ONLY)   Plan: Return in about 3 days (around 08/28/2018) for GYN u/s for IUD placement--[facility] to call.   Robynn Marcel B. Ford Peddie, PA-C 08/25/2018 9:19 AM

## 2018-08-25 NOTE — Patient Instructions (Signed)
I value your feedback and entrusting us with your care. If you get a [facility] patient survey, I would appreciate you taking the time to let us know about your experience today. Thank you! 

## 2018-08-30 ENCOUNTER — Ambulatory Visit (INDEPENDENT_AMBULATORY_CARE_PROVIDER_SITE_OTHER): Payer: Managed Care, Other (non HMO)

## 2018-08-30 DIAGNOSIS — N83292 Other ovarian cyst, left side: Secondary | ICD-10-CM | POA: Diagnosis not present

## 2018-08-30 DIAGNOSIS — Z30431 Encounter for routine checking of intrauterine contraceptive device: Secondary | ICD-10-CM

## 2018-08-30 DIAGNOSIS — T8332XA Displacement of intrauterine contraceptive device, initial encounter: Secondary | ICD-10-CM

## 2018-08-31 ENCOUNTER — Telehealth: Payer: Self-pay | Admitting: Obstetrics and Gynecology

## 2018-08-31 NOTE — Telephone Encounter (Signed)
Angel Johnson, Please let pt know IUD in correct location. Has a small LT ovarian cyst which isn't worrisome. If has LLQ discomfort, can take NSAIDs. We will recheck ultrasound in 8 wks. I can call her next wk when back in town to answer her questions.

## 2018-08-31 NOTE — Telephone Encounter (Signed)
Pt aware. Transferred to Sara to schedule appt. 

## 2020-08-28 NOTE — Telephone Encounter (Signed)
Mirena rcvd/charged 07/28/2018

## 2021-06-22 NOTE — Progress Notes (Signed)
Patient, No Pcp Per (Inactive)   Chief Complaint  Patient presents with   Pelvic Pain    Right side only x 1 month    HPI:      [name] is a 35 y.o. G1P1001 whose LMP was No LMP recorded. (Menstrual status: IUD)., presents today for RLQ pain for the past month. Sx are triggered by sex and are a nagging pain during IC, then become more intense for about an hr after sex until tylenol starts to work. Also has some "nagging" pain at other times. Not described as sharp, stabbing, throbbing, etc. No GI, urin, vag sx. No LBP, fevers. No bleeding with or after sex. Has IUD, Mirena placed 07/28/18. Pt is amenorrheic, no vag bleeding/spotting.  Last pap 04/2017. Has a hx of small vaginal cyst at LT introitus for many yrs. Has gotten larger. Not painful to pt but bothersome and would like removed.   Past Medical History:  Diagnosis Date   No pertinent past medical history      Past Surgical History:  Procedure Laterality Date   WISDOM TOOTH EXTRACTION      Family History  Problem Relation Age of Onset   Breast cancer Paternal Grandmother    Hypertension Paternal Grandmother     Social History   Socioeconomic History   Marital status: Single    Spouse name: Not on file   Number of children: Not on file   Years of education: Not on file   Highest education level: Not on file  Occupational History   Not on file  Tobacco Use   Smoking status: Never   Smokeless tobacco: Never  Vaping Use   Vaping Use: Never used  Substance and Sexual Activity   Alcohol use: No   Drug use: No   Sexual activity: Yes    Birth control/protection: I.U.D.    Comment: Mirena  Other Topics Concern   Not on file  Social History Narrative   Not on file   Social Determinants of Health   Financial Resource Strain: Not on file  Food Insecurity: Not on file  Transportation Needs: Not on file  Physical Activity: Not on file  Stress: Not on file  Social Connections: Not on file   Intimate Partner Violence: Not on file    Outpatient Medications Prior to Visit  Medication Sig Dispense Refill   levonorgestrel (MIRENA) 20 MCG/24HR IUD 1 each by Intrauterine route once.     DENTA 5000 PLUS 1.1 % CREA dental cream   10   ibuprofen (ADVIL,MOTRIN) 800 MG tablet Take 1 tablet (800 mg total) by mouth every 8 (eight) hours as needed. 4 tablet 0   No facility-administered medications prior to visit.      ROS:  Review of Systems  Constitutional:  Negative for fever.  Gastrointestinal:  Negative for blood in stool, constipation, diarrhea, nausea and vomiting.  Genitourinary:  Positive for dyspareunia and pelvic pain. Negative for dysuria, flank pain, frequency, hematuria, urgency, vaginal bleeding, vaginal discharge and vaginal pain.  Musculoskeletal:  Negative for back pain.  Skin:  Negative for rash.  BREAST: No symptoms   OBJECTIVE:   Vitals:  BP 130/90   Ht 5\' 6"  (1.676 m)   Wt 179 lb (81.2 kg)   BMI 28.89 kg/m   Physical Exam Vitals reviewed.  Constitutional:      Appearance: She is well-developed.  Pulmonary:     Effort: Pulmonary effort is normal.  Abdominal:     Palpations:  Abdomen is soft.     Tenderness: There is abdominal tenderness in the right lower quadrant. There is no guarding or rebound.  Genitourinary:    General: Normal vulva.     Pubic Area: No rash.      Labia:        Right: No rash, tenderness or lesion.        Left: No rash, tenderness or lesion.      Vagina: Lesions present. No vaginal discharge, erythema or tenderness.     Cervix: Normal.     Uterus: Normal. Not enlarged and not tender.      Adnexa: Right adnexa normal and left adnexa normal.       Right: No mass or tenderness.         Left: No mass or tenderness.         Comments: IUD STRINGS NOT IN CX OS Musculoskeletal:        General: Normal range of motion.     Cervical back: Normal range of motion.  Skin:    General: Skin is warm and dry.  Neurological:      General: No focal deficit present.     Mental Status: She is alert and oriented to person, place, and time.  Psychiatric:        Mood and Affect: Mood normal.        Behavior: Behavior normal.        Thought Content: Thought content normal.        Judgment: Judgment normal.    Results: Results for orders placed or performed in visit on 06/23/21 (from the past 24 hour(s))  POCT urine pregnancy     Status: Normal   Collection Time: 06/23/21 11:01 AM  Result Value Ref Range   Preg Test, Ur Negative Negative     Assessment/Plan: RLQ abdominal pain - Plan: US PELVIC COMPLETE WITH TRANSVAGINAL, POCT urine pregnancy; neg UPT, has IUD. slightly tender on exam. Check GYN u/s, will f/u with results. If neg, question MSK etiology given sx hx.   Dyspareunia in female - Plan: US PELVIC COMPLETE WITH TRANSVAGINAL  Encounter for routine checking of intrauterine contraceptive device (IUD) - Plan: US PELVIC COMPLETE WITH TRANSVAGINAL  Intrauterine contraceptive device threads lost, initial encounter - Plan: US PELVIC COMPLETE WITH TRANSVAGINAL; IUD strings not visible/palpable on exam. Check placement with GYN u/s.   Cervical cancer screening - Plan: Cytology - PAP  Screening for HPV (human papillomavirus) - Plan: Cytology - PAP  Vaginal inclusion cyst--refer to MD for discussion of removal.     Return in about 1 week (around 06/30/2021) for with [facility] for vaginal cyst surg consult.  Angel Johnson B. Angel Odor, PA-C 06/23/2021 11:02 AM

## 2021-06-23 ENCOUNTER — Other Ambulatory Visit: Payer: Self-pay

## 2021-06-23 ENCOUNTER — Other Ambulatory Visit (HOSPITAL_COMMUNITY)
Admission: RE | Admit: 2021-06-23 | Discharge: 2021-06-23 | Disposition: A | Discharge status: Home / Self Care | Payer: Managed Care, Other (non HMO) | Source: Ambulatory Visit | Attending: Obstetrics and Gynecology | Admitting: Obstetrics and Gynecology

## 2021-06-23 ENCOUNTER — Encounter: Payer: Self-pay | Admitting: Obstetrics and Gynecology

## 2021-06-23 ENCOUNTER — Ambulatory Visit: Payer: BC Managed Care – PPO | Admitting: Obstetrics and Gynecology

## 2021-06-23 VITALS — BP 130/90 | Ht 66.0 in | Wt 179.0 lb

## 2021-06-23 DIAGNOSIS — N941 Unspecified dyspareunia: Secondary | ICD-10-CM

## 2021-06-23 DIAGNOSIS — Z30431 Encounter for routine checking of intrauterine contraceptive device: Secondary | ICD-10-CM | POA: Diagnosis not present

## 2021-06-23 DIAGNOSIS — R1031 Right lower quadrant pain: Secondary | ICD-10-CM | POA: Diagnosis not present

## 2021-06-23 DIAGNOSIS — T8332XA Displacement of intrauterine contraceptive device, initial encounter: Secondary | ICD-10-CM

## 2021-06-23 DIAGNOSIS — Z124 Encounter for screening for malignant neoplasm of cervix: Secondary | ICD-10-CM

## 2021-06-23 DIAGNOSIS — N898 Other specified noninflammatory disorders of vagina: Secondary | ICD-10-CM

## 2021-06-23 DIAGNOSIS — Z1151 Encounter for screening for human papillomavirus (HPV): Secondary | ICD-10-CM

## 2021-06-23 LAB — POCT URINE PREGNANCY: Preg Test, Ur: NEGATIVE

## 2021-06-24 LAB — CYTOLOGY - PAP
Comment: NEGATIVE
Diagnosis: NEGATIVE
High risk HPV: NEGATIVE

## 2021-07-01 ENCOUNTER — Other Ambulatory Visit: Payer: Self-pay | Admitting: Obstetrics and Gynecology

## 2021-07-01 ENCOUNTER — Other Ambulatory Visit: Payer: Self-pay

## 2021-07-01 ENCOUNTER — Ambulatory Visit (INDEPENDENT_AMBULATORY_CARE_PROVIDER_SITE_OTHER): Payer: BC Managed Care – PPO

## 2021-07-01 DIAGNOSIS — T8332XA Displacement of intrauterine contraceptive device, initial encounter: Secondary | ICD-10-CM | POA: Diagnosis not present

## 2021-07-01 DIAGNOSIS — Z30431 Encounter for routine checking of intrauterine contraceptive device: Secondary | ICD-10-CM

## 2021-07-01 DIAGNOSIS — R1031 Right lower quadrant pain: Secondary | ICD-10-CM | POA: Diagnosis not present

## 2021-07-01 DIAGNOSIS — N941 Unspecified dyspareunia: Secondary | ICD-10-CM | POA: Diagnosis not present

## 2021-07-02 ENCOUNTER — Telehealth: Payer: Self-pay | Admitting: Obstetrics and Gynecology

## 2021-07-02 DIAGNOSIS — N83202 Unspecified ovarian cyst, left side: Secondary | ICD-10-CM

## 2021-07-02 NOTE — Telephone Encounter (Signed)
Pt aware of GYN u/s results. IUD in correct location. RTO not seen (pt with RLQ discomfort); LTO with 4.4. complex cyst. No pain on LT side. Rechk u/s in 3 months. Order placed for in office but may need to change to [facility] depending on u/s availability.

## 2021-07-16 ENCOUNTER — Other Ambulatory Visit: Payer: Self-pay

## 2021-07-16 ENCOUNTER — Encounter: Payer: Self-pay | Admitting: Obstetrics & Gynecology

## 2021-07-16 ENCOUNTER — Ambulatory Visit (INDEPENDENT_AMBULATORY_CARE_PROVIDER_SITE_OTHER): Payer: BC Managed Care – PPO | Admitting: Obstetrics & Gynecology

## 2021-07-16 VITALS — BP 122/74 | Ht 66.0 in | Wt 182.0 lb

## 2021-07-16 DIAGNOSIS — N898 Other specified noninflammatory disorders of vagina: Secondary | ICD-10-CM | POA: Diagnosis not present

## 2021-07-16 NOTE — H&P (View-Only) (Signed)
Pt is a 35 yo with 2 year h/o vaginal lump towards opening on left side, has slowly grown during this time.  Is not occlusive or significantly painful, but is bothersome to her.   PMHx: She  has a past medical history of No pertinent past medical history. Also,  has a past surgical history that includes Wisdom tooth extraction., family history includes Breast cancer in her paternal grandmother; Hypertension in her paternal grandmother.,  reports that she has never smoked. She has never used smokeless tobacco. She reports that she does not drink alcohol and does not use drugs.  She has a current medication list which includes the following prescription(s): levonorgestrel. Also, has No Known Allergies.  Review of Systems  Constitutional:  Negative for chills, fever and malaise/fatigue.  HENT:  Negative for congestion, sinus pain and sore throat.   Eyes:  Negative for blurred vision and pain.  Respiratory:  Negative for cough and wheezing.   Cardiovascular:  Negative for chest pain and leg swelling.  Gastrointestinal:  Negative for abdominal pain, constipation, diarrhea, heartburn, nausea and vomiting.  Genitourinary:  Negative for dysuria, frequency, hematuria and urgency.  Musculoskeletal:  Negative for back pain, joint pain, myalgias and neck pain.  Skin:  Negative for itching and rash.  Neurological:  Negative for dizziness, tremors and weakness.  Endo/Heme/Allergies:  Does not bruise/bleed easily.  Psychiatric/Behavioral:  Negative for depression. The patient is not nervous/anxious and does not have insomnia.    Objective: BP 122/74    Ht 5\' 6"  (1.676 m)    Wt 182 lb (82.6 kg)    BMI 29.38 kg/m  Physical Exam Constitutional:      General: She is not in acute distress.    Appearance: She is well-developed.  Genitourinary:     Genitourinary Comments: 2 cm cyst at introitus , NT.  Wide base. Not amenable to office excision.     Right Labia: No rash or tenderness.    Left Labia: No  tenderness or rash.       No vaginal erythema or bleeding.      Right Adnexa: not tender and no mass present.    Left Adnexa: not tender and no mass present.    No cervical motion tenderness, discharge, polyp or nabothian cyst.     Uterus is not enlarged.     No uterine mass detected.    Pelvic exam was performed with patient in the lithotomy position.  HENT:     Head: Normocephalic and atraumatic.     Nose: Nose normal.  Abdominal:     General: There is no distension.     Palpations: Abdomen is soft.     Tenderness: There is no abdominal tenderness.  Musculoskeletal:        General: Normal range of motion.  Neurological:     Mental Status: She is alert and oriented to person, place, and time.     Cranial Nerves: No cranial nerve deficit.  Skin:    General: Skin is warm and dry.  Psychiatric:        Attention and Perception: Attention normal.        Mood and Affect: Mood and affect normal.        Speech: Speech normal.        Behavior: Behavior normal.        Thought Content: Thought content normal.        Judgment: Judgment normal.    ASSESSMENT/PLAN:    Problem List Items Addressed This  Visit     Vaginal inclusion cyst    -  Primary     Discussed option of excision, and its recovery She desires this procedure, will plan for in hospital under sedation  Barnett Applebaum, MD, [facility], [facility] 07/16/2021  10:27 AM

## 2021-07-16 NOTE — Progress Notes (Signed)
Pt is a 35 yo with 2 year h/o vaginal lump towards opening on left side, has slowly grown during this time.  Is not occlusive or significantly painful, but is bothersome to her.   PMHx: She  has a past medical history of No pertinent past medical history. Also,  has a past surgical history that includes Wisdom tooth extraction., family history includes Breast cancer in her paternal grandmother; Hypertension in her paternal grandmother.,  reports that she has never smoked. She has never used smokeless tobacco. She reports that she does not drink alcohol and does not use drugs.  She has a current medication list which includes the following prescription(s): levonorgestrel. Also, has No Known Allergies.  Review of Systems  Constitutional:  Negative for chills, fever and malaise/fatigue.  HENT:  Negative for congestion, sinus pain and sore throat.   Eyes:  Negative for blurred vision and pain.  Respiratory:  Negative for cough and wheezing.   Cardiovascular:  Negative for chest pain and leg swelling.  Gastrointestinal:  Negative for abdominal pain, constipation, diarrhea, heartburn, nausea and vomiting.  Genitourinary:  Negative for dysuria, frequency, hematuria and urgency.  Musculoskeletal:  Negative for back pain, joint pain, myalgias and neck pain.  Skin:  Negative for itching and rash.  Neurological:  Negative for dizziness, tremors and weakness.  Endo/Heme/Allergies:  Does not bruise/bleed easily.  Psychiatric/Behavioral:  Negative for depression. The patient is not nervous/anxious and does not have insomnia.    Objective: BP 122/74    Ht 5\' 6"  (1.676 m)    Wt 182 lb (82.6 kg)    BMI 29.38 kg/m  Physical Exam Constitutional:      General: She is not in acute distress.    Appearance: She is well-developed.  Genitourinary:     Genitourinary Comments: 2 cm cyst at introitus , NT.  Wide base. Not amenable to office excision.     Right Labia: No rash or tenderness.    Left Labia: No  tenderness or rash.       No vaginal erythema or bleeding.      Right Adnexa: not tender and no mass present.    Left Adnexa: not tender and no mass present.    No cervical motion tenderness, discharge, polyp or nabothian cyst.     Uterus is not enlarged.     No uterine mass detected.    Pelvic exam was performed with patient in the lithotomy position.  HENT:     Head: Normocephalic and atraumatic.     Nose: Nose normal.  Abdominal:     General: There is no distension.     Palpations: Abdomen is soft.     Tenderness: There is no abdominal tenderness.  Musculoskeletal:        General: Normal range of motion.  Neurological:     Mental Status: She is alert and oriented to person, place, and time.     Cranial Nerves: No cranial nerve deficit.  Skin:    General: Skin is warm and dry.  Psychiatric:        Attention and Perception: Attention normal.        Mood and Affect: Mood and affect normal.        Speech: Speech normal.        Behavior: Behavior normal.        Thought Content: Thought content normal.        Judgment: Judgment normal.    ASSESSMENT/PLAN:    Problem List Items Addressed This  Visit     Vaginal inclusion cyst    -  Primary     Discussed option of excision, and its recovery She desires this procedure, will plan for in hospital under sedation  Barnett Applebaum, MD, [facility], [facility] 07/16/2021  10:27 AM

## 2021-07-31 ENCOUNTER — Other Ambulatory Visit: Payer: Self-pay | Admitting: Obstetrics & Gynecology

## 2021-07-31 ENCOUNTER — Telehealth: Payer: Self-pay | Admitting: Obstetrics & Gynecology

## 2021-07-31 DIAGNOSIS — N898 Other specified noninflammatory disorders of vagina: Secondary | ICD-10-CM

## 2021-07-31 NOTE — Telephone Encounter (Signed)
-----   Message from Nadara Mustard, MD sent at 07/16/2021 10:23 AM EST ----- Regarding: Surgery JAN Surgery Booking Request Patient Full Name:  Angel Johnson  MRN: [id number]  DOB: 1986/02/22  Surgeon: [name], MD  Requested Surgery Date and Time: Jan Primary Diagnosis AND Code: Vag Cyst Secondary Diagnosis and Code: N89.8 Surgical Procedure: Vaginal Excision of Cyst RNFA Requested?: No L&D Notification: No Admission Status: same day surgery Length of Surgery: 25 min Special Case Needs: Yes - use vaginal tray and suture, scapel H&P: No  Done today 07/16/21 Phone Interview???:  Yes Interpreter: No Medical Clearance:  No Special Scheduling Instructions: No Any known health/anesthesia issues, diabetes, sleep apnea, latex allergy, defibrillator/pacemaker?: No Acuity: P3   (P1 highest, P2 delay may cause harm, P3 low, elective gyn, P4 lowest) Post op follow up visits: Sch 1-2 weeks after procedure date

## 2021-07-31 NOTE — Telephone Encounter (Signed)
Spoke with the patient, surgery scheduled for 08/14/21, Pre-admit testing phone interview to be scheduled (patient will watch for MyChart notification), and Post-op on 2/2 @ 3:35pm. Patient confirmed [employer].

## 2021-08-07 ENCOUNTER — Other Ambulatory Visit
Admission: RE | Admit: 2021-08-07 | Discharge: 2021-08-07 | Disposition: A | Discharge status: Home / Self Care | Payer: BC Managed Care – PPO | Source: Ambulatory Visit | Attending: Obstetrics & Gynecology | Admitting: Obstetrics & Gynecology

## 2021-08-07 ENCOUNTER — Other Ambulatory Visit: Payer: Self-pay

## 2021-08-07 NOTE — Patient Instructions (Signed)
Your procedure is scheduled on: Thursday August 14, 2021. Report to Day Surgery inside [facility] 2nd floor, stop by admissions desk first before getting on elevator to register for procedure. To find out your arrival time please call [phone] between 1PM - 3PM on Wednesday August 13, 2021.  Remember: Instructions that are not followed completely may result in serious medical risk,  up to and including death, or upon the discretion of your surgeon and anesthesiologist your  surgery may need to be rescheduled.     _X__ 1. Do not eat food or drink fluids after midnight the night before your procedure.                 No chewing gum or hard candies.         __X__2.  On the morning of surgery brush your teeth with toothpaste and water, you                may rinse your mouth with mouthwash if you wish.  Do not swallow any toothpaste or mouthwash.     _X__ 3.  No Alcohol for 24 hours before or after surgery.   _X__ 4.  Do Not Smoke or use e-cigarettes For 24 Hours Prior to Your Surgery.                 Do not use any chewable tobacco products for at least 6 hours prior to                 Surgery.  _X__  5.  Do not use any recreational drugs (marijuana, cocaine, heroin, ecstasy, MDMA or other)                For at least one week prior to your surgery.  Combination of these drugs with anesthesia                May have life threatening results.  ____  6.  Bring all medications with you on the day of surgery if instructed.   __X_  7.  Notify your doctor if there is any change in your medical condition      (cold, fever, infections).     Do not wear jewelry, make-up, hairpins, clips or nail polish. Do not wear lotions, powders, or perfumes. You may wear deodorant. Do not shave 48 hours prior to surgery. Do not bring valuables to the hospital.    [facility] is not responsible for any belongings or valuables.  Contacts, dentures or bridgework may not be  worn into surgery. Leave your suitcase in the car. After surgery it may be brought to your room. For patients admitted to the hospital, discharge time is determined by your treatment team.   Patients discharged the day of surgery will not be allowed to drive home.   Make arrangements for someone to be with you for the first 24 hours of your Same Day Discharge.   __X__ Take these medicines the morning of surgery with A SIP OF WATER:    1. None   2.   3.   4.  5.  6.  ____ Fleet Enema (as directed)   ____ Use CHG Soap (or wipes) as directed  ____ Use Benzoyl Peroxide Gel as instructed  ____ Use inhalers on the day of surgery  ____ Stop metformin 2 days prior to surgery    ____ Take 1/2 of usual insulin dose the night before surgery. No insulin the morning  of surgery.   ____ Call your PCP, cardiologist, or Pulmonologist if taking Coumadin/Plavix/aspirin and ask when to stop before your surgery.   __X__ One Week prior to surgery- Stop Anti-inflammatories such as Ibuprofen, Aleve, Advil, Motrin, meloxicam (MOBIC), diclofenac, etodolac, ketorolac, Toradol, Daypro, piroxicam, Goody's or BC powders. OK TO USE TYLENOL IF NEEDED   __X__ Do not start any new vitamins and or supplements until after surgery.    ____ Bring C-Pap to the hospital.    If you have any questions regarding your pre-procedure instructions,  Please call Pre-admit Testing at [phone]

## 2021-08-13 MED ORDER — CHLORHEXIDINE GLUCONATE 0.12 % MT SOLN: 15.0000 mL | Freq: Once | OROMUCOSAL | Status: AC

## 2021-08-13 MED ORDER — LACTATED RINGERS IV SOLN: INTRAVENOUS | Status: DC

## 2021-08-13 MED ORDER — ORAL CARE MOUTH RINSE: 15.0000 mL | Freq: Once | OROMUCOSAL | Status: AC

## 2021-08-13 MED ORDER — POVIDONE-IODINE 10 % EX SWAB
2.0000 "application " | Freq: Once | CUTANEOUS | Status: AC
  Administered 2021-08-14: 2 via TOPICAL

## 2021-08-13 MED ORDER — FAMOTIDINE 20 MG PO TABS: 20.0000 mg | ORAL_TABLET | Freq: Once | ORAL | Status: AC

## 2021-08-14 ENCOUNTER — Ambulatory Visit
Admission: RE | Admit: 2021-08-14 | Discharge: 2021-08-14 | Disposition: A | Discharge status: Home / Self Care | Payer: BC Managed Care – PPO | Attending: Obstetrics & Gynecology | Admitting: Obstetrics & Gynecology

## 2021-08-14 ENCOUNTER — Ambulatory Visit: Payer: BC Managed Care – PPO | Admitting: Anesthesiology

## 2021-08-14 ENCOUNTER — Encounter
Admission: RE | Disposition: A | Discharge status: Home / Self Care | Payer: Self-pay | Source: Home / Self Care | Attending: Obstetrics & Gynecology

## 2021-08-14 ENCOUNTER — Other Ambulatory Visit: Payer: Self-pay

## 2021-08-14 ENCOUNTER — Encounter: Payer: Self-pay | Admitting: Obstetrics & Gynecology

## 2021-08-14 DIAGNOSIS — N898 Other specified noninflammatory disorders of vagina: Secondary | ICD-10-CM | POA: Diagnosis not present

## 2021-08-14 HISTORY — PX: EXCISION VAGINAL CYST: SHX5825

## 2021-08-14 LAB — POCT PREGNANCY, URINE: Preg Test, Ur: NEGATIVE

## 2021-08-14 SURGERY — EXCISION, CYST, VAGINA
Anesthesia: General

## 2021-08-14 MED ORDER — ACETAMINOPHEN 10 MG/ML IV SOLN: 1000.0000 mg | Freq: Once | INTRAVENOUS | Status: DC | PRN

## 2021-08-14 MED ORDER — FENTANYL CITRATE (PF) 100 MCG/2ML IJ SOLN
INTRAMUSCULAR | Status: DC | PRN
Start: 2021-08-14 — End: 2021-08-14
  Administered 2021-08-14: 25 ug via INTRAVENOUS

## 2021-08-14 MED ORDER — OXYCODONE HCL 5 MG PO TABS: 5.0000 mg | ORAL_TABLET | Freq: Once | ORAL | Status: DC | PRN

## 2021-08-14 MED ORDER — MIDAZOLAM HCL 2 MG/2ML IJ SOLN
INTRAMUSCULAR | Status: DC | PRN
  Administered 2021-08-14: 2 mg via INTRAVENOUS

## 2021-08-14 MED ORDER — FAMOTIDINE 20 MG PO TABS
ORAL_TABLET | ORAL | Status: AC
  Administered 2021-08-14: 20 mg via ORAL
  Filled 2021-08-14: qty 1

## 2021-08-14 MED ORDER — PROPOFOL 500 MG/50ML IV EMUL
INTRAVENOUS | Status: AC
  Filled 2021-08-14: qty 50

## 2021-08-14 MED ORDER — ACETAMINOPHEN 650 MG RE SUPP
650.0000 mg | RECTAL | Status: DC | PRN
  Filled 2021-08-14: qty 1

## 2021-08-14 MED ORDER — DEXMEDETOMIDINE HCL IN NACL 200 MCG/50ML IV SOLN
INTRAVENOUS | Status: AC
  Filled 2021-08-14: qty 50

## 2021-08-14 MED ORDER — MIDAZOLAM HCL 2 MG/2ML IJ SOLN
INTRAMUSCULAR | Status: AC
  Filled 2021-08-14: qty 2

## 2021-08-14 MED ORDER — FENTANYL CITRATE (PF) 100 MCG/2ML IJ SOLN
INTRAMUSCULAR | Status: AC
  Filled 2021-08-14: qty 2

## 2021-08-14 MED ORDER — OXYCODONE HCL 5 MG/5ML PO SOLN: 5.0000 mg | Freq: Once | ORAL | Status: DC | PRN

## 2021-08-14 MED ORDER — LACTATED RINGERS IV SOLN: INTRAVENOUS | Status: DC

## 2021-08-14 MED ORDER — KETOROLAC TROMETHAMINE 30 MG/ML IJ SOLN
INTRAMUSCULAR | Status: DC | PRN
  Administered 2021-08-14: 30 mg via INTRAVENOUS

## 2021-08-14 MED ORDER — ACETAMINOPHEN 325 MG PO TABS: 650.0000 mg | ORAL_TABLET | ORAL | Status: DC | PRN

## 2021-08-14 MED ORDER — CHLORHEXIDINE GLUCONATE 0.12 % MT SOLN
OROMUCOSAL | Status: AC
  Administered 2021-08-14: 15 mL via OROMUCOSAL
  Filled 2021-08-14: qty 15

## 2021-08-14 MED ORDER — 0.9 % SODIUM CHLORIDE (POUR BTL) OPTIME
TOPICAL | Status: DC | PRN
  Administered 2021-08-14: 100 mL

## 2021-08-14 MED ORDER — PROPOFOL 10 MG/ML IV BOLUS
INTRAVENOUS | Status: DC | PRN
  Administered 2021-08-14: 50 mg via INTRAVENOUS

## 2021-08-14 MED ORDER — ONDANSETRON HCL 4 MG/2ML IJ SOLN: 4.0000 mg | Freq: Once | INTRAMUSCULAR | Status: DC | PRN

## 2021-08-14 MED ORDER — LIDOCAINE-EPINEPHRINE 1 %-1:100000 IJ SOLN
INTRAMUSCULAR | Status: AC
  Filled 2021-08-14: qty 1

## 2021-08-14 MED ORDER — MORPHINE SULFATE (PF) 2 MG/ML IV SOLN: 1.0000 mg | INTRAVENOUS | Status: DC | PRN

## 2021-08-14 MED ORDER — FENTANYL CITRATE (PF) 100 MCG/2ML IJ SOLN: 25.0000 ug | INTRAMUSCULAR | Status: DC | PRN

## 2021-08-14 MED ORDER — PROPOFOL 500 MG/50ML IV EMUL
INTRAVENOUS | Status: DC | PRN
  Administered 2021-08-14: 100 ug/kg/min via INTRAVENOUS

## 2021-08-14 MED ORDER — LIDOCAINE-EPINEPHRINE 1 %-1:100000 IJ SOLN
INTRAMUSCULAR | Status: DC | PRN
  Administered 2021-08-14: 10 mL

## 2021-08-14 MED ORDER — DEXMEDETOMIDINE HCL IN NACL 200 MCG/50ML IV SOLN
INTRAVENOUS | Status: DC | PRN
  Administered 2021-08-14: 8 ug via INTRAVENOUS

## 2021-08-14 MED ORDER — OXYCODONE-ACETAMINOPHEN 5-325 MG PO TABS: 1.0000 | ORAL_TABLET | ORAL | Status: DC | PRN

## 2021-08-14 MED ORDER — LIDOCAINE HCL (PF) 2 % IJ SOLN
INTRAMUSCULAR | Status: AC
  Filled 2021-08-14: qty 5

## 2021-08-14 MED ORDER — OXYCODONE-ACETAMINOPHEN 5-325 MG PO TABS: 1.0000 | ORAL_TABLET | ORAL | 0 refills | Status: DC | PRN

## 2021-08-14 MED ORDER — KETOROLAC TROMETHAMINE 30 MG/ML IJ SOLN
INTRAMUSCULAR | Status: AC
  Filled 2021-08-14: qty 1

## 2021-08-14 SURGICAL SUPPLY — 34 items
BLADE SURG 15 STRL LF DISP TIS (BLADE) ×1 IMPLANT
BLADE SURG 15 STRL SS (BLADE) ×2
BLADE SURG SZ10 CARB STEEL (BLADE) ×2 IMPLANT
CATH ROBINSON RED A/P 16FR (CATHETERS) ×2 IMPLANT
DRAPE PERI LITHO V/GYN (MISCELLANEOUS) ×2 IMPLANT
DRAPE UNDER BUTTOCK W/FLU (DRAPES) ×2 IMPLANT
DRSG TELFA 3X8 NADH (GAUZE/BANDAGES/DRESSINGS) ×2 IMPLANT
ELECT CAUTERY BLADE 6.4 (BLADE) ×2 IMPLANT
ELECT CAUTERY NEEDLE TIP 1.0 (MISCELLANEOUS) ×2
ELECT REM PT RETURN 9FT ADLT (ELECTROSURGICAL) ×2
ELECTRODE CAUTERY NEDL TIP 1.0 (MISCELLANEOUS) ×1 IMPLANT
ELECTRODE REM PT RTRN 9FT ADLT (ELECTROSURGICAL) ×1 IMPLANT
GAUZE 4X4 16PLY ~~LOC~~+RFID DBL (SPONGE) ×2 IMPLANT
GLOVE SURG ENC MOIS LTX SZ7.5 (GLOVE) ×7 IMPLANT
GOWN STRL REUS W/ TWL LRG LVL3 (GOWN DISPOSABLE) ×1 IMPLANT
GOWN STRL REUS W/ TWL XL LVL3 (GOWN DISPOSABLE) ×1 IMPLANT
GOWN STRL REUS W/TWL LRG LVL3 (GOWN DISPOSABLE) ×2
GOWN STRL REUS W/TWL XL LVL3 (GOWN DISPOSABLE) ×2
MANIFOLD NEPTUNE II (INSTRUMENTS) ×2 IMPLANT
NEEDLE HYPO 22GX1.5 SAFETY (NEEDLE) ×2 IMPLANT
NS IRRIG 500ML POUR BTL (IV SOLUTION) ×2 IMPLANT
PACK BASIN MINOR ARMC (MISCELLANEOUS) ×2 IMPLANT
PAD DRESSING TELFA 3X8 NADH (GAUZE/BANDAGES/DRESSINGS) ×1 IMPLANT
PAD OB MATERNITY 4.3X12.25 (PERSONAL CARE ITEMS) ×2 IMPLANT
PAD PREP 24X41 OB/GYN DISP (PERSONAL CARE ITEMS) ×2 IMPLANT
SCRUB EXIDINE 4% CHG 4OZ (MISCELLANEOUS) ×2 IMPLANT
SUT CHROMIC 2 0 SH (SUTURE) ×2 IMPLANT
SUT VIC AB 0 CT1 27 (SUTURE)
SUT VIC AB 0 CT1 27XCR 8 STRN (SUTURE) ×1 IMPLANT
SUT VIC AB 2-0 CT1 (SUTURE) ×1 IMPLANT
SUT VIC AB 3-0 SH 27 (SUTURE) ×2
SUT VIC AB 3-0 SH 27X BRD (SUTURE) IMPLANT
SYR CONTROL 10ML LL (SYRINGE) ×2 IMPLANT
WATER STERILE IRR 500ML POUR (IV SOLUTION) ×2 IMPLANT

## 2021-08-14 NOTE — Op Note (Signed)
Angel Johnson PROCEDURE DATE: 08/14/2021  PREOPERATIVE DIAGNOSIS: Left Vaginal Cyst POSTOPERATIVE DIAGNOSIS: The same PROCEDURE: Excision of Left Vaginal Cyst, Vaginal repair/ plasty at introitus SURGEON: Annamarie Major, MD, FACOG FINDINGS:  2 cm left wide base vaginal cyst near introitus    Drainage noted to be thick brown.    ANESTHESIA: Local Sedation ESTIMATED BLOOD LOSS: min SPECIMENS: Cyst COMPLICATIONS: None immediate  PROCEDURE IN DETAIL:  The patient was taken to the operating room where anesthesia was administered and was found to be adequate.  She was placed in the dorsal lithotomy position, and was prepped and draped in a sterile manner.  After an adequate timeout was performed, attention was turned to the left side of her vulva where the vaginal cyst was visualized and also palpated. An incision was made over the cyst around the introitus. The cyst was dissected free, with rupture of the cyst as we were concluding its excision mostly intact.  Excess vaginal mucosa is excised, and then the deep aspect of the defect is closed with a 2-0 chromic suture followed by a subcuticular closure using a 3-0 vicryl suture. Good hemostasis was noted. Patient goes to PACU in good condition; all sponges, sharps, and instruments accounted for.  Annamarie Major, MD, [facility], [facility] 08/14/2021  11:20 AM

## 2021-08-14 NOTE — Transfer of Care (Signed)
Immediate Anesthesia Transfer of Care Note  Patient: Elizet Kaplan  Procedure(s) Performed: EXCISION VAGINAL CYST  Patient Location: PACU  Anesthesia Type:MAC  Level of Consciousness: drowsy  Airway & Oxygen Therapy: Patient Spontanous Breathing  Post-op Assessment: Report given to RN and Post -op Vital signs reviewed and stable  Post vital signs: Reviewed and stable  Last Vitals:  Vitals Value Taken Time  BP 110/74 08/14/21 1117  Temp 36.5 C 08/14/21 1117  Pulse 78 08/14/21 1119  Resp 12 08/14/21 1119  SpO2 100 % 08/14/21 1119  Vitals shown include unvalidated device data.  Last Pain:  Vitals:   08/14/21 0953  TempSrc: Oral  PainSc: 0-No pain         Complications: No notable events documented.

## 2021-08-14 NOTE — Discharge Instructions (Signed)
AMBULATORY SURGERY  ?DISCHARGE INSTRUCTIONS ? ? ?The drugs that you were given will stay in your system until tomorrow so for the next 24 hours you should not: ? ?Drive an automobile ?Make any legal decisions ?Drink any alcoholic beverage ? ? ?You may resume regular meals tomorrow.  Today it is better to start with liquids and gradually work up to solid foods. ? ?You may eat anything you prefer, but it is better to start with liquids, then soup and crackers, and gradually work up to solid foods. ? ? ?Please notify your doctor immediately if you have any unusual bleeding, trouble breathing, redness and pain at the surgery site, drainage, fever, or pain not relieved by medication. ? ? ? ?Additional Instructions: ? ? ? ?Please contact your physician with any problems or Same Day Surgery at [phone], Monday through Friday 6 am to 4 pm, or  at [employer] Main number at [phone].  ?

## 2021-08-14 NOTE — Anesthesia Preprocedure Evaluation (Signed)
Anesthesia Evaluation  Patient identified by MRN, date of birth, ID band Patient awake    Reviewed: Allergy & Precautions, NPO status , Patient's Chart, lab work & pertinent test results  History of Anesthesia Complications Negative for: history of anesthetic complications  Airway Mallampati: III   Neck ROM: Full    Dental no notable dental hx.    Pulmonary neg pulmonary ROS,    Pulmonary exam normal breath sounds clear to auscultation       Cardiovascular Exercise Tolerance: Good negative cardio ROS Normal cardiovascular exam Rhythm:Regular Rate:Normal     Neuro/Psych negative neurological ROS     GI/Hepatic negative GI ROS,   Endo/Other  negative endocrine ROS  Renal/GU negative Renal ROS     Musculoskeletal   Abdominal   Peds  Hematology negative hematology ROS (+)   Anesthesia Other Findings   Reproductive/Obstetrics                             Anesthesia Physical Anesthesia Plan  ASA: 1  Anesthesia Plan: General   Post-op Pain Management:    Induction: Intravenous  PONV Risk Score and Plan: 3 and Propofol infusion, TIVA, Treatment may vary due to age or medical condition and Ondansetron  Airway Management Planned: Natural Airway  Additional Equipment:   Intra-op Plan:   Post-operative Plan:   Informed Consent: I have reviewed the patients History and Physical, chart, labs and discussed the procedure including the risks, benefits and alternatives for the proposed anesthesia with the patient or authorized representative who has indicated his/her understanding and acceptance.       Plan Discussed with: CRNA  Anesthesia Plan Comments: (LMA/GETA backup discussed.  Patient consented for risks of anesthesia including but not limited to:  - adverse reactions to medications - damage to eyes, teeth, lips or other oral mucosa - nerve damage due to positioning  - sore  throat or hoarseness - damage to heart, brain, nerves, lungs, other parts of body or loss of life  Informed patient about role of CRNA in peri- and intra-operative care.  Patient voiced understanding.)        Anesthesia Quick Evaluation

## 2021-08-14 NOTE — Interval H&P Note (Signed)
History and Physical Interval Note:  08/14/2021 10:21 AM  Angel Johnson  has presented today for surgery, with the diagnosis of Vaginal Cyst N89.8.  The various methods of treatment have been discussed with the patient and family. After consideration of risks, benefits and other options for treatment, the patient has consented to  Procedure(s): EXCISION VAGINAL CYST (N/A) as a surgical intervention.  The patient's history has been reviewed, patient examined, no change in status, stable for surgery.  I have reviewed the patient's chart and labs.  Questions were answered to the patient's satisfaction.     [name]

## 2021-08-14 NOTE — Anesthesia Postprocedure Evaluation (Signed)
Anesthesia Post Note  Patient: Angel Johnson  Procedure(s) Performed: EXCISION VAGINAL CYST  Patient location during evaluation: PACU Anesthesia Type: General Level of consciousness: awake and alert, oriented and patient cooperative Pain management: pain level controlled Vital Signs Assessment: post-procedure vital signs reviewed and stable Respiratory status: spontaneous breathing, nonlabored ventilation and respiratory function stable Cardiovascular status: blood pressure returned to baseline and stable Postop Assessment: adequate PO intake Anesthetic complications: no   No notable events documented.   Last Vitals:  Vitals:   08/14/21 1145 08/14/21 1155  BP: 111/83 116/76  Pulse: 78 64  Resp: 13 15  Temp:  (!) 35.7 C  SpO2: 100% 100%    Last Pain:  Vitals:   08/14/21 1155  TempSrc: Temporal  PainSc: 2                  Reed Breech

## 2021-08-15 LAB — SURGICAL PATHOLOGY

## 2021-08-28 ENCOUNTER — Encounter: Payer: Self-pay | Admitting: Obstetrics & Gynecology

## 2021-08-28 ENCOUNTER — Ambulatory Visit: Payer: BC Managed Care – PPO

## 2021-08-28 ENCOUNTER — Other Ambulatory Visit: Payer: Self-pay

## 2021-08-28 ENCOUNTER — Ambulatory Visit (INDEPENDENT_AMBULATORY_CARE_PROVIDER_SITE_OTHER): Payer: BC Managed Care – PPO | Admitting: Obstetrics & Gynecology

## 2021-08-28 VITALS — BP 120/80 | Ht 63.0 in | Wt 186.0 lb

## 2021-08-28 DIAGNOSIS — N898 Other specified noninflammatory disorders of vagina: Secondary | ICD-10-CM

## 2021-08-28 NOTE — Progress Notes (Signed)
°  Postoperative Follow-up Patient presents post op from  excision of vaginal cyst  for  its discomfort amd size , 2 weeks ago.  Path: DIAGNOSIS:  A. VAGINAL CYST; EXCISION:  - SKIN WITH UNDERLYING CYSTIC AREA LINED BY ABUNDANT HEMOSIDERIN-LADEN  MACROPHAGES, FOAMY MACROPHAGES, AND SURROUNDING FIBROSIS.  - SEE COMMENT.  - NEGATIVE FOR MALIGNANCY.   Comment:  The cystic area lacks an epithelial lining. While endometrial glands and  stroma are not identified, the histologic findings could be compatible  with an area of "burnt out" endometriosis/endometriotic cyst.   Subjective: Patient reports marked improvement in her preop symptoms. Eating a regular diet without difficulty. The patient is not having any pain.  Activity: normal activities of daily living. Patient reports additional symptom's since surgery of None.  Objective: BP 120/80    Ht 5\' 3"  (1.6 m)    Wt 186 lb (84.4 kg)    BMI 32.95 kg/m  Physical Exam Constitutional:      General: She is not in acute distress.    Appearance: She is well-developed.  Genitourinary:     Rectum normal.     No vaginal erythema or bleeding.      Right Adnexa: not tender and no mass present.    Left Adnexa: not tender and no mass present.    No cervical motion tenderness, discharge or lesion.     Uterus is not enlarged or tender.     No uterine mass detected. Cardiovascular:     Rate and Rhythm: Normal rate.  Pulmonary:     Effort: Pulmonary effort is normal.  Abdominal:     General: There is no distension.     Palpations: Abdomen is soft.     Tenderness: There is no abdominal tenderness.  Musculoskeletal:        General: Normal range of motion.  Neurological:     Mental Status: She is alert and oriented to person, place, and time.     Cranial Nerves: No cranial nerve deficit.  Skin:    General: Skin is warm and dry.    Assessment: s/p :   excision of vaginal cyst  progressing well  Plan: Patient has done well after surgery  with no apparent complications.  I have discussed the post-operative course to date, and the expected progress moving forward.  The patient understands what complications to be concerned about.  I will see the patient in routine follow up, or sooner if needed.    Activity plan: No restriction.  08/28/2021, 3:43 PM

## 2021-09-25 ENCOUNTER — Encounter: Payer: Self-pay | Admitting: Obstetrics and Gynecology

## 2021-09-25 ENCOUNTER — Ambulatory Visit (INDEPENDENT_AMBULATORY_CARE_PROVIDER_SITE_OTHER): Payer: BC Managed Care – PPO

## 2021-09-25 ENCOUNTER — Other Ambulatory Visit: Payer: Self-pay

## 2021-09-25 ENCOUNTER — Other Ambulatory Visit: Payer: Self-pay | Admitting: Obstetrics and Gynecology

## 2021-09-25 DIAGNOSIS — N83202 Unspecified ovarian cyst, left side: Secondary | ICD-10-CM | POA: Diagnosis not present

## 2023-03-20 ENCOUNTER — Encounter: Payer: Self-pay | Admitting: Obstetrics and Gynecology

## 2023-05-03 NOTE — Progress Notes (Unsigned)
   PCP:  Patient, No Pcp Per   No chief complaint on file.    HPI:      [name] is a 37 y.o. G1P1001 whose LMP was No LMP recorded. (Menstrual status: IUD)., presents today for her annual examination.  Her menses are {norm/abn:715}, lasting {number: 22536} days.  Dysmenorrhea {dysmen:716}. She {does:18564} have intermenstrual bleeding.  Sex activity: {sex active: 315163}. Mirena placed 07/28/18 Last Pap: 06/23/21 Results were: no abnormalities /neg HPV DNA  Hx of STDs: {STD hx:14358}  There is no FH of breast cancer. There is no FH of ovarian cancer. The patient {does:18564} do self-breast exams.  Tobacco use: {tob:20664} Alcohol use: {Alcohol:11675} No drug use.  Exercise: {exercise:31265}  She {does:18564} get adequate calcium and Vitamin D in her diet.  Patient Active Problem List   Diagnosis Date Noted   Vaginal cyst 08/14/2021    Past Surgical History:  Procedure Laterality Date   EXCISION VAGINAL CYST N/A 08/14/2021   Procedure: EXCISION VAGINAL CYST;  Surgeon: Nadara Mustard, MD;  Location: [facility] ORS;  Service: Gynecology;  Laterality: N/A;   WISDOM TOOTH EXTRACTION      Family History  Problem Relation Age of Onset   Breast cancer Paternal Grandmother    Hypertension Paternal Grandmother     Social History   Socioeconomic History   Marital status: Single    Spouse name: Not on file   Number of children: Not on file   Years of education: Not on file   Highest education level: Not on file  Occupational History   Not on file  Tobacco Use   Smoking status: Never   Smokeless tobacco: Never  Vaping Use   Vaping status: Never Used  Substance and Sexual Activity   Alcohol use: No   Drug use: No   Sexual activity: Yes    Birth control/protection: I.U.D.    Comment: Mirena  Other Topics Concern   Not on file  Social History Narrative   Not on file   Social Determinants of Health   Financial Resource Strain: Not on file  Food Insecurity:  Not on file  Transportation Needs: Not on file  Physical Activity: Not on file  Stress: Not on file  Social Connections: Not on file  Intimate Partner Violence: Not on file     Current Outpatient Medications:    acetaminophen (TYLENOL) 500 MG tablet, Take 1,000 mg by mouth every 6 (six) hours as needed for moderate pain. (Patient not taking: Reported on 08/28/2021), Disp: , Rfl:    levonorgestrel (MIRENA) 20 MCG/24HR IUD, 1 each by Intrauterine route once., Disp: , Rfl:    oxyCODONE-acetaminophen (PERCOCET/ROXICET) 5-325 MG tablet, Take 1 tablet by mouth every 4 (four) hours as needed for moderate pain. (Patient not taking: Reported on 08/28/2021), Disp: 10 tablet, Rfl: 0     ROS:  Review of Systems BREAST: No symptoms   Objective: There were no vitals taken for this visit.   OBGyn Exam  Results: No results found for this or any previous visit (from the past 24 hour(s)).  Assessment/Plan: No diagnosis found.  No orders of the defined types were placed in this encounter.            GYN counsel {counseling: 16159}     F/U  No follow-ups on file.  Daulton Harbaugh B. Gaudencio Chesnut, PA-C 05/03/2023 4:29 PM

## 2023-05-04 ENCOUNTER — Ambulatory Visit (INDEPENDENT_AMBULATORY_CARE_PROVIDER_SITE_OTHER): Payer: BC Managed Care – PPO | Admitting: Obstetrics and Gynecology

## 2023-05-04 ENCOUNTER — Encounter: Payer: Self-pay | Admitting: Obstetrics and Gynecology

## 2023-05-04 VITALS — BP 112/78 | Ht 66.0 in | Wt 188.0 lb

## 2023-05-04 DIAGNOSIS — R1031 Right lower quadrant pain: Secondary | ICD-10-CM

## 2023-05-04 DIAGNOSIS — Z01419 Encounter for gynecological examination (general) (routine) without abnormal findings: Secondary | ICD-10-CM

## 2023-05-04 DIAGNOSIS — Z30431 Encounter for routine checking of intrauterine contraceptive device: Secondary | ICD-10-CM

## 2023-05-04 NOTE — Patient Instructions (Signed)
I value your feedback and you entrusting us with your care. If you get a [facility] patient survey, I would appreciate you taking the time to let us know about your experience today. Thank you! ? ? ?

## 2023-05-06 ENCOUNTER — Other Ambulatory Visit: Payer: BC Managed Care – PPO

## 2023-05-06 ENCOUNTER — Telehealth: Payer: Self-pay

## 2023-05-06 NOTE — Telephone Encounter (Signed)
Pt came in office this morning for fasting labs. No labs in system and I dont see anything in your note about returning to office for labs.

## 2023-05-09 NOTE — Telephone Encounter (Signed)
We discussed that labs with PCP were normal a year or so ago and doesn't need them till next year. Can do this year if she prefers and I would put orders in. Never told her to come back for labs this yr.

## 2023-05-10 NOTE — Telephone Encounter (Signed)
Pt aware. She says [facility] wrote on her paper to return for labs. She will wait for labs next year.
